# Patient Record
Sex: Female | Born: 1975 | Race: Black or African American | Hispanic: No | Marital: Single | Smoking: Current every day smoker
Health system: Southern US, Community
[De-identification: ages and names within clinical notes are randomized; demographics above are authoritative.]

---

## 2017-05-25 ENCOUNTER — Other Ambulatory Visit: Payer: Self-pay

## 2017-05-25 ENCOUNTER — Encounter: Payer: Self-pay | Admitting: *Deleted

## 2017-05-25 ENCOUNTER — Emergency Department
Admission: EM | Admit: 2017-05-25 | Discharge: 2017-05-25 | Disposition: A | Discharge status: Home / Self Care | Payer: Self-pay | Attending: Emergency Medicine | Admitting: Emergency Medicine

## 2017-05-25 ENCOUNTER — Emergency Department: Payer: Self-pay

## 2017-05-25 DIAGNOSIS — F17210 Nicotine dependence, cigarettes, uncomplicated: Secondary | ICD-10-CM | POA: Insufficient documentation

## 2017-05-25 DIAGNOSIS — S62521A Displaced fracture of distal phalanx of right thumb, initial encounter for closed fracture: Secondary | ICD-10-CM | POA: Insufficient documentation

## 2017-05-25 DIAGNOSIS — Y929 Unspecified place or not applicable: Secondary | ICD-10-CM | POA: Insufficient documentation

## 2017-05-25 DIAGNOSIS — Y998 Other external cause status: Secondary | ICD-10-CM | POA: Insufficient documentation

## 2017-05-25 DIAGNOSIS — W2105XA Struck by basketball, initial encounter: Secondary | ICD-10-CM | POA: Insufficient documentation

## 2017-05-25 DIAGNOSIS — Y9367 Activity, basketball: Secondary | ICD-10-CM | POA: Insufficient documentation

## 2017-05-25 MED ORDER — TRAMADOL HCL 50 MG PO TABS: 50.0000 mg | ORAL_TABLET | Freq: Four times a day (QID) | ORAL | 0 refills | Status: AC | PRN

## 2017-05-25 MED ORDER — TRAMADOL HCL 50 MG PO TABS
50.0000 mg | ORAL_TABLET | Freq: Once | ORAL | Status: AC
  Administered 2017-05-25: 50 mg via ORAL
  Filled 2017-05-25: qty 1

## 2017-05-25 NOTE — ED Notes (Signed)
See triage note  Presents with pain and swelling to left thumb  States she thinks she jammed her thumb couple of days ago

## 2017-05-25 NOTE — ED Triage Notes (Signed)
Pt reports while playing with her children over the weekend she thought she jammed her left thumb. Pain has not subsided. Swelling noted to finger. PT reports increased pain with movement.

## 2017-05-25 NOTE — Discharge Instructions (Addendum)
Please wear splint at all times except for showering.  Take tramadol as needed for moderate to severe pain.  Use Tylenol and/or ibuprofen as needed for mild pain.  Call orthopedic office tomorrow morning to schedule follow-up appointment..Marland Kitchen

## 2017-05-25 NOTE — ED Provider Notes (Signed)
[facility] EMERGENCY DEPARTMENT Provider Note   CSN: [id number] Arrival date & time: 05/25/17  1729     History   Chief Complaint Chief Complaint  Patient presents with  . Finger Injury    HPI Isabel LawrenceJanice Page is a 42 y.o. female.  Presents to the emergency department for evaluation of left thumb injury.  Patient states 2 days ago she was playing basketball and blocked a shot, developed thumb pain and has been treating this with Aleve over the last couple days.  Due to continued pain she comes into the emergency department.  Her pain is along the base of the distal phalanx dorsally.  She denies any bleeding.  Swelling has been slowly improving.  She denies any other pain or injury throughout her hand.  She is right-hand dominant.  She works as a Production designer, theatre/television/filmmanager.  HPI  History reviewed. No pertinent past medical history.  There are no active problems to display for this patient.   History reviewed. No pertinent surgical history.   OB History   None      Home Medications    Prior to Admission medications   Medication Sig Start Date End Date Taking? Authorizing Provider  traMADol (ULTRAM) 50 MG tablet Take 1 tablet (50 mg total) by mouth every 6 (six) hours as needed. 05/25/17 05/25/18  Evon SlackGaines, Michol Emory C, PA-C    Family History History reviewed. No pertinent family history.  Social History Social History   Tobacco Use  . Smoking status: Current Every Day Smoker    Packs/day: 0.50    Types: Cigarettes  . Smokeless tobacco: Never Used  Substance Use Topics  . Alcohol use: Never    Frequency: Never  . Drug use: Never     Allergies   Penicillins and Reglan [metoclopramide]   Review of Systems Review of Systems  Constitutional: Negative for fever.  Respiratory: Negative for shortness of breath.   Cardiovascular: Negative for chest pain.  Gastrointestinal: Negative for abdominal pain.  Genitourinary: Negative for difficulty urinating, dysuria and  urgency.  Musculoskeletal: Positive for arthralgias and joint swelling. Negative for back pain and myalgias.  Skin: Negative for rash.  Neurological: Negative for dizziness and headaches.     Physical Exam Updated Vital Signs BP 132/87 (BP Location: Right Arm)   Pulse 98   Temp 98.7 F (37.1 C) (Oral)   Resp 16   Ht 5' 5.5" (1.664 m)   Wt 92.5 kg (204 lb)   SpO2 99%   BMI 33.43 kg/m   Physical Exam  Constitutional: She is oriented to person, place, and time. She appears well-developed and well-nourished.  HENT:  Head: Normocephalic and atraumatic.  Eyes: Conjunctivae are normal.  Neck: Normal range of motion.  Cardiovascular: Normal rate.  Pulmonary/Chest: Effort normal. No respiratory distress.  Musculoskeletal:  Patient is tender to the left thumb DIP joint.  She has a long long nail on all 5 digits.  Unable to evaluate for subungual hematoma.  No ecchymosis noted around the nail that is present.  No bleeding or skin breakdown noted.  Extension of the DIP joint but limited flexion.  She is nontender along the MCP joint of the thumb.  Neurological: She is alert and oriented to person, place, and time.  Skin: Skin is warm. No rash noted.  Psychiatric: She has a normal mood and affect. Her behavior is normal. Thought content normal.     ED Treatments / Results  Labs (all labs ordered are listed, but only abnormal  results are displayed) Labs Reviewed - No data to display  EKG None  Radiology Dg Finger Thumb Left  Result Date: 05/25/2017 CLINICAL DATA:  Thumb pain EXAM: LEFT THUMB 2+V COMPARISON:  None FINDINGS: Acute, comminuted fracture involving the mid and proximal aspect of the first distal phalanx with probable articular extension on oblique view. No subluxation. No radiopaque foreign body. IMPRESSION: Acute, comminuted, likely intra-articular fracture involving the mid to proximal aspect of the first distal phalanx. Electronically Signed   By: Jasmine Pang M.D.    On: 05/25/2017 19:00    Procedures .Splint Application Date/Time: 05/25/2017 7:42 PM Performed by: Evon Slack, PA-C Authorized by: Evon Slack, PA-C   Consent:    Consent obtained:  Verbal   Consent given by:  Patient   Risks discussed:  Numbness, pain and swelling Pre-procedure details:    Sensation:  Normal Procedure details:    Laterality:  Left   Location:  Finger   Finger:  L thumb   Strapping: no     Supplies:  Aluminum splint Post-procedure details:    Pain:  Unchanged   Sensation:  Normal   Patient tolerance of procedure:  Tolerated well, no immediate complications   (including critical care time)  Medications Ordered in ED Medications  traMADol (ULTRAM) tablet 50 mg (50 mg Oral Given 05/25/17 1935)     Initial Impression / Assessment and Plan / ED Course  I have reviewed the triage vital signs and the nursing notes.  Pertinent labs & imaging results that were available during my care of the patient were reviewed by me and considered in my medical decision making (see chart for details).     42 year old female with trauma to the left thumb 2 days ago.  X-rays ordered and reviewed by me today show comminuted intra-articular fracture along the base the distal phalanx.  There is mild displacement.  No sign of dislocation.  No skin breakdown noted.  It is been 2 days since the injury, there there is possibly subungual to hematoma but due to fake nails, unable to visualize.  Discussed removing nail with the patient but will hold off at this time due to unlikelihood that 2 days out from injury we would be able to remove any possible subungual hematoma present.  Patient is placed into a AlumaFoam splint for the thumb.  She will call orthopedic office tomorrow morning.  She is given tramadol for pain.  He is educated on signs and symptoms return to the ED for.  Continue with Aleve. Final Clinical Impressions(s) / ED Diagnoses   Final diagnoses:  Closed displaced  fracture of distal phalanx of right thumb, initial encounter    ED Discharge Orders        Ordered    traMADol (ULTRAM) 50 MG tablet  Every 6 hours PRN     05/25/17 1936       Ronnette Juniper 05/25/17 1944    Myrna Blazer, MD 05/25/17 2042

## 2019-01-01 IMAGING — DX DG FINGER THUMB 2+V*L*
3 series · 3 of 3 positions shown · non-contrast
Comparison: None

CLINICAL DATA: Thumb pain

EXAM:
LEFT THUMB 2+V

[finger ap]
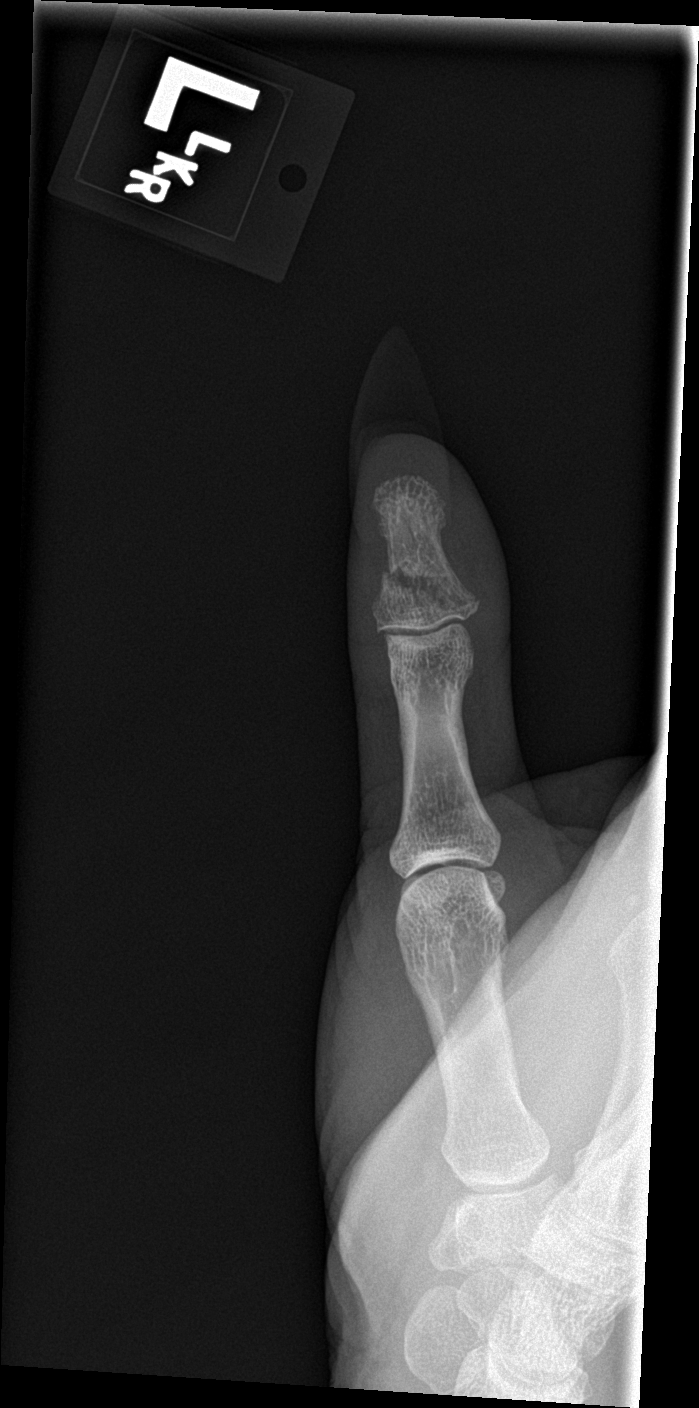

[finger obl]
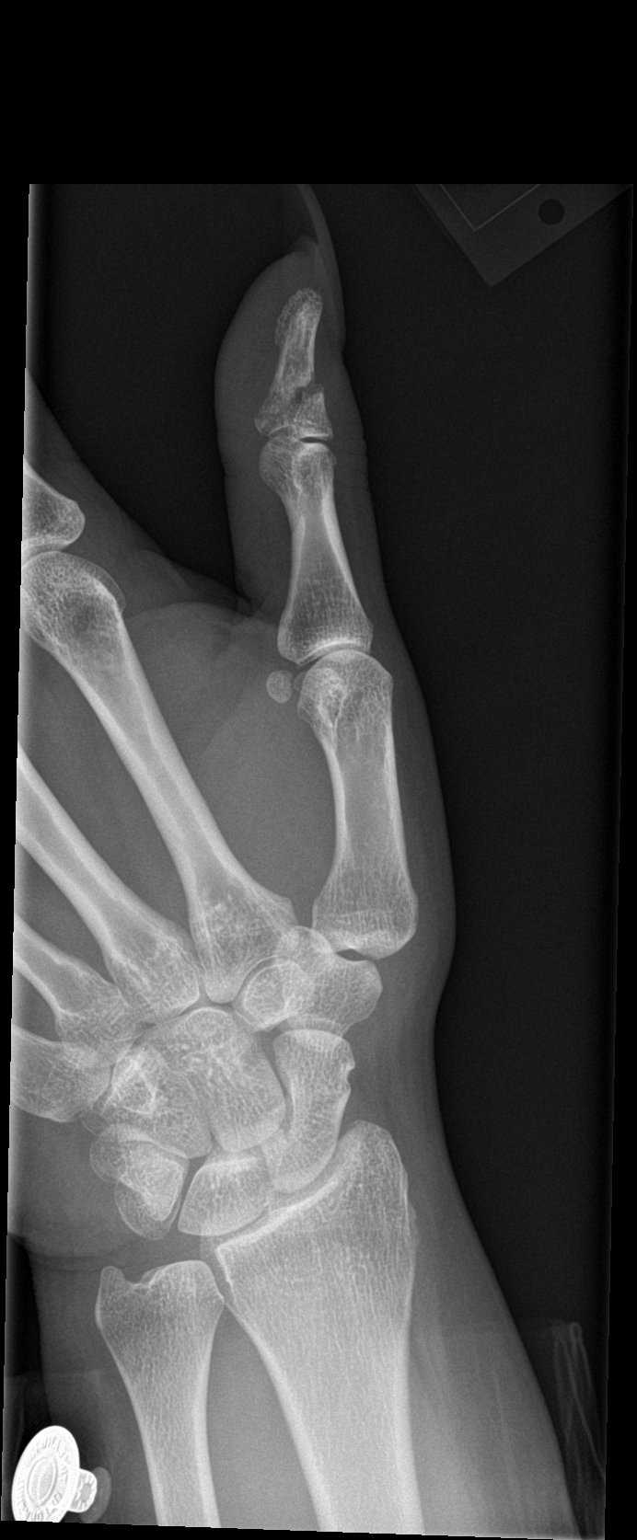

[finger lat]
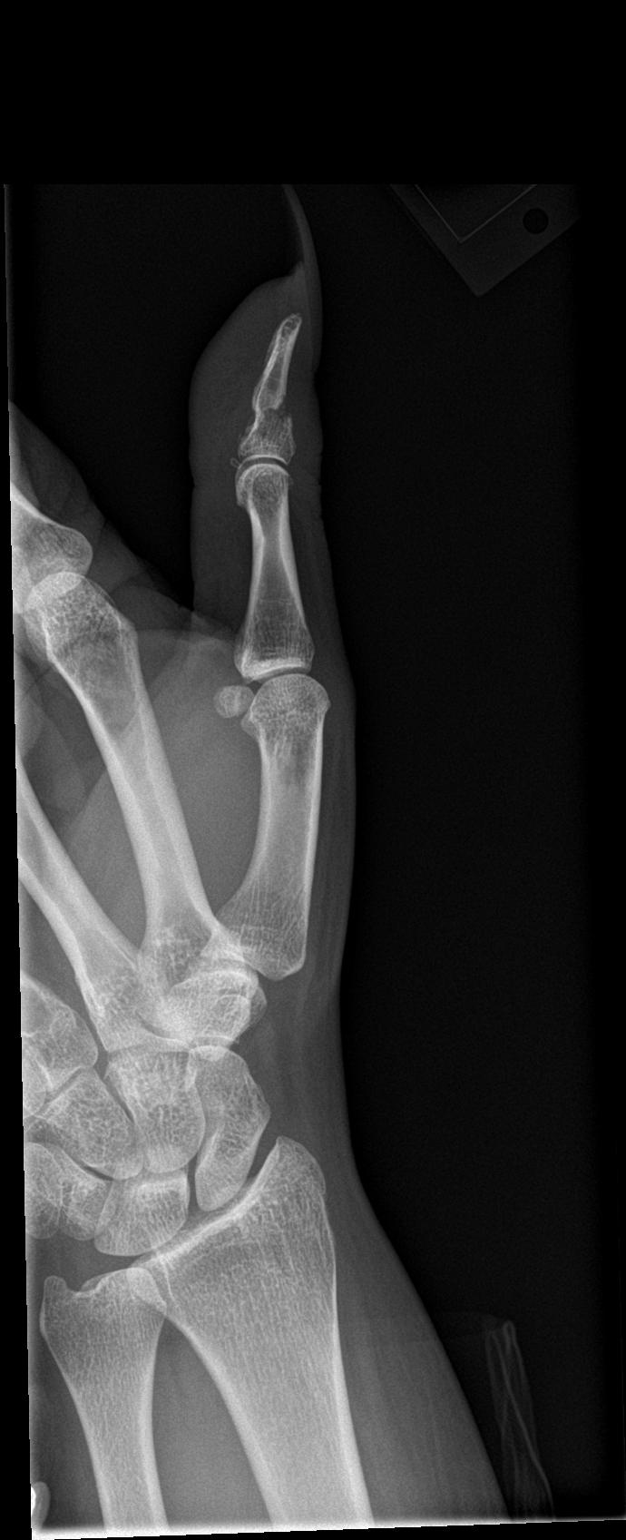

[3 of 3 positions shown; findings below may reference images not displayed]

FINDINGS: Acute, comminuted fracture involving the mid and proximal aspect of
the first distal phalanx with probable articular extension on
oblique view. No subluxation. No radiopaque foreign body.
IMPRESSION: Acute, comminuted, likely intra-articular fracture involving the mid
to proximal aspect of the first distal phalanx.
